# Patient Record
Sex: Male | Born: 1995 | Race: White | Hispanic: No | Marital: Single | Smoking: Never smoker
Health system: Southern US, Community
[De-identification: ages and names within clinical notes are randomized; demographics above are authoritative.]

## PROBLEM LIST (undated history)

## (undated) DIAGNOSIS — Q231 Congenital insufficiency of aortic valve: Secondary | ICD-10-CM

## (undated) HISTORY — DX: Congenital insufficiency of aortic valve: Q23.1

## (undated) HISTORY — PX: MYRINGOTOMY WITH TUBE PLACEMENT: SHX5663

---
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
## 2017-05-09 ENCOUNTER — Encounter: Payer: Managed Care, Other (non HMO) | Admitting: Physician Assistant

## 2017-05-09 ENCOUNTER — Encounter: Payer: Self-pay | Admitting: Physician Assistant

## 2017-05-09 ENCOUNTER — Ambulatory Visit (INDEPENDENT_AMBULATORY_CARE_PROVIDER_SITE_OTHER): Payer: Managed Care, Other (non HMO) | Admitting: Physician Assistant

## 2017-05-09 VITALS — BP 118/70 | HR 57 | Temp 98.6°F | Resp 14 | Ht 71.5 in | Wt 162.0 lb

## 2017-05-09 DIAGNOSIS — Z0000 Encounter for general adult medical examination without abnormal findings: Secondary | ICD-10-CM

## 2017-05-09 NOTE — Patient Instructions (Signed)
Preventive Care 18-39 Years, Male Preventive care refers to lifestyle choices and visits with your health care provider that can promote health and wellness. What does preventive care include?  A yearly physical exam. This is also called an annual well check.  Dental exams once or twice a year.  Routine eye exams. Ask your health care provider how often you should have your eyes checked.  Personal lifestyle choices, including: ? Daily care of your teeth and gums. ? Regular physical activity. ? Eating a healthy diet. ? Avoiding tobacco and drug use. ? Limiting alcohol use. ? Practicing safe sex. What happens during an annual well check? The services and screenings done by your health care provider during your annual well check will depend on your age, overall health, lifestyle risk factors, and family history of disease. Counseling Your health care provider may ask you questions about your:  Alcohol use.  Tobacco use.  Drug use.  Emotional well-being.  Home and relationship well-being.  Sexual activity.  Eating habits.  Work and work environment.  Screening You may have the following tests or measurements:  Height, weight, and BMI.  Blood pressure.  Lipid and cholesterol levels. These may be checked every 5 years starting at age 19.  Diabetes screening. This is done by checking your blood sugar (glucose) after you have not eaten for a while (fasting).  Skin check.  Hepatitis C blood test.  Hepatitis B blood test.  Sexually transmitted disease (STD) testing.  Discuss your test results, treatment options, and if necessary, the need for more tests with your health care provider. Vaccines Your health care provider may recommend certain vaccines, such as:  Influenza vaccine. This is recommended every year.  Tetanus, diphtheria, and acellular pertussis (Tdap, Td) vaccine. You may need a Td booster every 10 years.  Varicella vaccine. You may need this if you  have not been vaccinated.  HPV vaccine. If you are 26 or younger, you may need three doses over 6 months.  Measles, mumps, and rubella (MMR) vaccine. You may need at least one dose of MMR.You may also need a second dose.  Pneumococcal 13-valent conjugate (PCV13) vaccine. You may need this if you have certain conditions and have not been vaccinated.  Pneumococcal polysaccharide (PPSV23) vaccine. You may need one or two doses if you smoke cigarettes or if you have certain conditions.  Meningococcal vaccine. One dose is recommended if you are age 19-19 years and a first-year college student living in a residence hall, or if you have one of several medical conditions. You may also need additional booster doses.  Hepatitis A vaccine. You may need this if you have certain conditions or if you travel or work in places where you may be exposed to hepatitis A.  Hepatitis B vaccine. You may need this if you have certain conditions or if you travel or work in places where you may be exposed to hepatitis B.  Haemophilus influenzae type b (Hib) vaccine. You may need this if you have certain risk factors.  Talk to your health care provider about which screenings and vaccines you need and how often you need them. This information is not intended to replace advice given to you by your health care provider. Make sure you discuss any questions you have with your health care provider. Document Released: 11/16/2001 Document Revised: 06/09/2016 Document Reviewed: 07/22/2015 Elsevier Interactive Patient Education  2017 [employer].  

## 2017-05-09 NOTE — Progress Notes (Signed)
Pre visit review using our clinic review tool, if applicable. No additional management support is needed unless otherwise documented below in the visit note. 

## 2017-05-09 NOTE — Assessment & Plan Note (Signed)
Depression screen negative. Health Maintenance reviewed. Preventive schedule discussed and handout given in AVS. Will obtain fasting labs today. Discussed healthy diet and exercise regimen.  Discussed safe sex practices.  Denies STI screen today.

## 2017-05-09 NOTE — Progress Notes (Signed)
Patient presents to clinic today for annual exam.  Patient is fasting for labs.  Acute Concerns: Denies acute concerns today. Is playing basketball for [employer]. Needs clearance forms filled out today. Was recently evaluated by pediatric Cardiology for a murmur. Was noted to be related to bicuspid aortic valve. Nothing further was needed per Cardiology noted reviewed. Patient endorses well-balanced diet. Is very active. Patient denies chest pain, palpitations, lightheadedness, dizziness, vision changes or frequent headaches.   Health Maintenance: Immunizations -- up-to-date.  Past Medical History:  Diagnosis Date  . Bicuspid aortic valve     Past Surgical History:  Procedure Laterality Date  . MYRINGOTOMY WITH TUBE PLACEMENT      Current Outpatient Prescriptions on File Prior to Visit  Medication Sig Dispense Refill  . ibuprofen (ADVIL,MOTRIN) 200 MG tablet Take 400 mg by mouth every 6 (six) hours as needed for mild pain.    Marland Kitchen loratadine (CLARITIN) 10 MG tablet Take 10 mg by mouth daily.    . fluticasone (FLONASE) 50 MCG/ACT nasal spray Place 1 spray into both nostrils daily.     No current facility-administered medications on file prior to visit.     No Known Allergies  History reviewed. No pertinent family history.  Social History   Social History  . Marital status: Single    Spouse name: N/A  . Number of children: N/A  . Years of education: N/A   Occupational History  . Not on file.   Social History Main Topics  . Smoking status: Never Smoker  . Smokeless tobacco: Never Used  . Alcohol use No  . Drug use: No  . Sexual activity: Yes    Partners: Female    Birth control/ protection: Condom   Other Topics Concern  . Not on file   Social History Narrative  . No narrative on file   Review of Systems  Constitutional: Negative for fever and weight loss.  HENT: Negative for ear discharge, ear pain, hearing loss and tinnitus.   Eyes: Negative for blurred  vision, double vision, photophobia and pain.  Respiratory: Negative for cough and shortness of breath.   Cardiovascular: Negative for chest pain and palpitations.  Gastrointestinal: Negative for abdominal pain, blood in stool, constipation, diarrhea, heartburn, melena, nausea and vomiting.  Genitourinary: Negative for dysuria, flank pain, frequency, hematuria and urgency.  Musculoskeletal: Negative for falls.  Neurological: Negative for dizziness, loss of consciousness and headaches.  Endo/Heme/Allergies: Negative for environmental allergies.  Psychiatric/Behavioral: Negative for depression, hallucinations, substance abuse and suicidal ideas. The patient is not nervous/anxious and does not have insomnia.    BP 118/70   Pulse (!) 57   Temp 98.6 F (37 C) (Oral)   Resp 14   Ht 5' 11.5" (1.816 m)   Wt 162 lb (73.5 kg)   SpO2 99%   BMI 22.28 kg/m   Physical Exam  Constitutional: He is oriented to person, place, and time and well-developed, well-nourished, and in no distress.  HENT:  Head: Normocephalic and atraumatic.  Right Ear: External ear normal.  Left Ear: External ear normal.  Nose: Nose normal.  Mouth/Throat: Oropharynx is clear and moist. No oropharyngeal exudate.  Eyes: Pupils are equal, round, and reactive to light. Conjunctivae and EOM are normal.  Neck: Neck supple. No thyromegaly present.  Cardiovascular: Normal rate, regular rhythm, normal heart sounds and intact distal pulses.   Pulmonary/Chest: Effort normal and breath sounds normal. No respiratory distress. He has no wheezes. He has no rales. He exhibits no  tenderness.  Abdominal: Soft. Bowel sounds are normal. He exhibits no distension and no mass. There is no tenderness. There is no rebound and no guarding.  Genitourinary: Testes/scrotum normal and penis normal. No discharge found.  Lymphadenopathy:    He has no cervical adenopathy.  Neurological: He is alert and oriented to person, place, and time.  Skin: Skin is  warm and dry. No rash noted.  Psychiatric: Affect normal.  Vitals reviewed.  Assessment/Plan: Annual physical exam Depression screen negative. Health Maintenance reviewed. Preventive schedule discussed and handout given in AVS. Will obtain fasting labs today. Discussed healthy diet and exercise regimen.  Discussed safe sex practices.  Denies STI screen today.    Piedad ClimesMartin, Savion Washam Cody, PA-C

## 2018-03-07 ENCOUNTER — Ambulatory Visit (INDEPENDENT_AMBULATORY_CARE_PROVIDER_SITE_OTHER): Payer: Managed Care, Other (non HMO) | Admitting: Physician Assistant

## 2018-03-07 ENCOUNTER — Encounter: Payer: Self-pay | Admitting: Physician Assistant

## 2018-03-07 ENCOUNTER — Other Ambulatory Visit: Payer: Self-pay

## 2018-03-07 VITALS — BP 100/78 | HR 62 | Temp 98.4°F | Resp 14 | Ht 71.5 in | Wt 172.0 lb

## 2018-03-07 DIAGNOSIS — H60331 Swimmer's ear, right ear: Secondary | ICD-10-CM

## 2018-03-07 MED ORDER — NEOMYCIN-POLYMYXIN-HC 3.5-10000-1 OT SOLN: 3.0000 [drp] | Freq: Three times a day (TID) | OTIC | 0 refills | Status: DC

## 2018-03-07 NOTE — Patient Instructions (Signed)
Please use antibiotic drops as directed. Wear cotton in the ear when showering. Limit swimming until this is resolved. Tylenol if needed for pain.    Otitis Externa Otitis externa is an infection of the outer ear canal. The outer ear canal is the area between the outside of the ear and the eardrum. Otitis externa is sometimes called "swimmer's ear." Follow these instructions at home:  If you were given antibiotic ear drops, use them as told by your doctor. Do not stop using them even if your condition gets better.  Take over-the-counter and prescription medicines only as told by your doctor.  Keep all follow-up visits as told by your doctor. This is important. How is this prevented?  Keep your ear dry. Use the corner of a towel to dry your ear after you swim or bathe.  Try not to scratch or put things in your ear. Doing these things makes it easier for germs to grow in your ear.  Avoid swimming in lakes, dirty water, or pools that may not have the right amount of a chemical called chlorine.  Consider making ear drops and putting 3 or 4 drops in each ear after you swim. Ask your doctor about how you can make ear drops. Contact a doctor if:  You have a fever.  After 3 days your ear is still red, swollen, or painful.  After 3 days you still have pus coming from your ear.  Your redness, swelling, or pain gets worse.  You have a really bad headache.  You have redness, swelling, pain, or tenderness behind your ear. This information is not intended to replace advice given to you by your health care provider. Make sure you discuss any questions you have with your health care provider. Document Released: 03/08/2008 Document Revised: 10/16/2015 Document Reviewed: 06/30/2015 Elsevier Interactive Patient Education  [employer]2018 Elsevier Inc.

## 2018-03-07 NOTE — Progress Notes (Signed)
   Patient presents to clinic today c/o R ear pain x 3 days starting after swimming at the pool. Denies trauma or injury. Denies drainage from ear of change in hearing. Denies fever, chills or URI symptoms. Has significant history of swimmer's ear and notes this feels similar. Last time he notes it got severely infected so he wanted to be seen much sooner this go-round..   Past Medical History:  Diagnosis Date  . Bicuspid aortic valve     Current Outpatient Medications on File Prior to Visit  Medication Sig Dispense Refill  . diphenhydrAMINE (BENADRYL) 50 MG capsule Take 50 mg by mouth every 6 (six) hours as needed.     No current facility-administered medications on file prior to visit.     No Known Allergies  History reviewed. No pertinent family history.  Social History   Socioeconomic History  . Marital status: Single    Spouse name: Not on file  . Number of children: Not on file  . Years of education: Not on file  . Highest education level: Not on file  Occupational History  . Not on file  Social Needs  . Financial resource strain: Not on file  . Food insecurity:    Worry: Not on file    Inability: Not on file  . Transportation needs:    Medical: Not on file    Non-medical: Not on file  Tobacco Use  . Smoking status: Never Smoker  . Smokeless tobacco: Never Used  Substance and Sexual Activity  . Alcohol use: No  . Drug use: No  . Sexual activity: Yes    Partners: Female    Birth control/protection: Condom  Lifestyle  . Physical activity:    Days per week: Not on file    Minutes per session: Not on file  . Stress: Not on file  Relationships  . Social connections:    Talks on phone: Not on file    Gets together: Not on file    Attends religious service: Not on file    Active member of club or organization: Not on file    Attends meetings of clubs or organizations: Not on file    Relationship status: Not on file  Other Topics Concern  . Not on file    Social History Narrative  . Not on file   Review of Systems - See HPI.  All other ROS are negative.  BP 100/78   Pulse 62   Temp 98.4 F (36.9 C) (Oral)   Resp 14   Ht 6' (1.829 m)   Wt 172 lb (78 kg)   BMI 23.33 kg/m   Physical Exam  Constitutional: He is oriented to person, place, and time. He appears well-developed and well-nourished.  HENT:  Head: Normocephalic and atraumatic.  Right Ear: Ear canal normal. There is swelling.  Left Ear: External ear normal.  Nose: Nose normal.  Mouth/Throat: Oropharynx is clear and moist.  Neck: Neck supple.  Cardiovascular: Normal rate, regular rhythm and normal heart sounds.  Pulmonary/Chest: Effort normal.  Lymphadenopathy:    He has no cervical adenopathy.  Neurological: He is alert and oriented to person, place, and time.  Vitals reviewed.  Assessment/Plan: 1. Acute swimmer's ear of right side Mild. Supportive measures and OTC medications reviewed. Start cortisporin otic. Follow-up if not improving.  - neomycin-polymyxin-hydrocortisone (CORTISPORIN) OTIC solution; Place 3 drops into the right ear 3 (three) times daily.  Dispense: 10 mL; Refill: 0   Piedad ClimesWilliam Cody Scheryl Sanborn, PA-C

## 2018-04-18 ENCOUNTER — Ambulatory Visit (INDEPENDENT_AMBULATORY_CARE_PROVIDER_SITE_OTHER): Payer: Managed Care, Other (non HMO)

## 2018-04-18 DIAGNOSIS — Z23 Encounter for immunization: Secondary | ICD-10-CM

## 2018-04-18 NOTE — Progress Notes (Signed)
Patient came in today to receive his first PPD vaccination and has been notified to come back for a recheck in 24-36 hours to be read. Patient verbalized an understanding and tolerated well.  PPD given in left forearm.

## 2018-04-21 ENCOUNTER — Encounter: Payer: Self-pay | Admitting: General Practice

## 2018-04-21 LAB — TB SKIN TEST
INDURATION: 0 mm
TB Skin Test: NEGATIVE

## 2019-04-04 ENCOUNTER — Other Ambulatory Visit: Payer: Self-pay

## 2019-04-04 DIAGNOSIS — Z20822 Contact with and (suspected) exposure to covid-19: Secondary | ICD-10-CM

## 2019-04-10 LAB — NOVEL CORONAVIRUS, NAA: SARS-CoV-2, NAA: NOT DETECTED

## 2019-04-10 LAB — SPECIMEN STATUS REPORT

## 2020-02-28 ENCOUNTER — Other Ambulatory Visit: Payer: Self-pay

## 2020-02-28 ENCOUNTER — Encounter: Payer: Self-pay | Admitting: Family Medicine

## 2020-02-28 ENCOUNTER — Telehealth (INDEPENDENT_AMBULATORY_CARE_PROVIDER_SITE_OTHER): Payer: Managed Care, Other (non HMO) | Admitting: Family Medicine

## 2020-02-28 VITALS — Temp 98.1°F | Ht 71.0 in | Wt 181.0 lb

## 2020-02-28 DIAGNOSIS — J020 Streptococcal pharyngitis: Secondary | ICD-10-CM | POA: Diagnosis not present

## 2020-02-28 DIAGNOSIS — Z20828 Contact with and (suspected) exposure to other viral communicable diseases: Secondary | ICD-10-CM | POA: Diagnosis not present

## 2020-02-28 DIAGNOSIS — Z03818 Encounter for observation for suspected exposure to other biological agents ruled out: Secondary | ICD-10-CM | POA: Diagnosis not present

## 2020-02-28 MED ORDER — AMOXICILLIN 875 MG PO TABS
875.0000 mg | ORAL_TABLET | Freq: Two times a day (BID) | ORAL | 0 refills | Status: DC
Start: 2020-02-28 — End: 2020-09-01

## 2020-02-28 NOTE — Progress Notes (Signed)
   Virtual Visit via Video   I connected with patient on 02/28/20 at  4:00 PM EDT by a video enabled telemedicine application and verified that I am speaking with the correct person using two identifiers.  Location patient: Home Location provider: [facility], Office Persons participating in the virtual visit: Patient, Provider, CMA (Jess B)  I discussed the limitations of evaluation and management by telemedicine and the availability of in person appointments. The patient expressed understanding and agreed to proceed.  Subjective:   HPI:   Sore throat- sxs started Sat/Sun.  No fevers.  Pt had rapid COVID test today and it was negative.  + pain w/ swallowing.  Some improvement w/ ibuprofen.  + sore lymph nodes.  ROS:   See pertinent positives and negatives per HPI.  Patient Active Problem List   Diagnosis Date Noted  . Annual physical exam 05/09/2017  . Aortic valve, bicuspid 05/20/2015  . Abnormal ECG 05/20/2015    Social History   Tobacco Use  . Smoking status: Never Smoker  . Smokeless tobacco: Never Used  Substance Use Topics  . Alcohol use: No   No current outpatient medications on file.  No Known Allergies  Objective:   Temp 98.1 F (36.7 C) (Oral)   Ht 5\' 11"  (1.803 m)   Wt 181 lb (82.1 kg)   BMI 25.24 kg/m  AAOx3, NAD NCAT, EOMI No obvious CN deficits Coloring WNL Erythematous posterior pharynx w/ mildly enlarged tonsils bilaterally and exudates present, L>R Pt is able to speak clearly, coherently without shortness of breath or increased work of breathing.  Thought process is linear.  Mood is appropriate.   Assessment and Plan:   Strep Pharyngitis- new.  Pt's sxs and PE are concerning for possible strep w/ exception of lack of fever.  COVID test negative.  Will start empiric Amoxicillin but did discuss that if pt has mono, he could develop a rash from the Amox.  Pt is aware and understands that he would need to stop abx and call office.   Continue ibuprofen for pain relief.  Pt expressed understanding and is in agreement w/ plan.   , MD 02/28/2020

## 2020-02-28 NOTE — Progress Notes (Signed)
I have discussed the procedure for the virtual visit with the patient who has given consent to proceed with assessment and treatment.   Pt unable to obtain vitals.   Yorel Redder L Namrata Dangler, CMA     

## 2020-07-10 ENCOUNTER — Telehealth: Payer: Self-pay

## 2020-07-10 NOTE — Telephone Encounter (Signed)
Received referral from [facility] [id number] Referral sent to scheduling Records on File

## 2020-09-01 ENCOUNTER — Ambulatory Visit (INDEPENDENT_AMBULATORY_CARE_PROVIDER_SITE_OTHER): Payer: BC Managed Care – PPO | Admitting: Cardiovascular Disease

## 2020-09-01 ENCOUNTER — Encounter: Payer: Self-pay | Admitting: Cardiovascular Disease

## 2020-09-01 ENCOUNTER — Other Ambulatory Visit: Payer: Self-pay

## 2020-09-01 VITALS — BP 118/66 | HR 55 | Ht 72.0 in | Wt 192.6 lb

## 2020-09-01 DIAGNOSIS — Q231 Congenital insufficiency of aortic valve: Secondary | ICD-10-CM

## 2020-09-01 NOTE — Patient Instructions (Signed)
Medication Instructions:  Your physician recommends that you continue on your current medications as directed. Please refer to the Current Medication list given to you today.  *If you need a refill on your cardiac medications before your next appointment, please call your pharmacy*   Testing/Procedures: Your physician has requested that you have an echocardiogram. Echocardiography is a painless test that uses sound waves to create images of your heart. It provides your doctor with information about the size and shape of your heart and how well your heart's chambers and valves are working. This procedure takes approximately one hour. There are no restrictions for this procedure.   Follow-Up: At [facility], you and your health needs are our priority.  As part of our continuing mission to provide you with exceptional heart care, we have created designated Provider Care Teams.  These Care Teams include your primary Cardiologist (physician) and Advanced Practice Providers (APPs -  Physician Assistants and Nurse Practitioners) who all work together to provide you with the care you need, when you need it.  Your next appointment:   1 year(s)  The format for your next appointment:   In Person  Provider:   You may see Christopher McAlhany, MD or one of the following Advanced Practice Providers on your designated Care Team:    Dayna Dunn, PA-C  Michele Lenze, PA-C    

## 2020-09-01 NOTE — Progress Notes (Signed)
Chief Complaint  Patient presents with  . New Patient (Initial Visit)    Bicuspid aortic valve   History of Present Illness: 24 yo male with history of bicuspid aortic valve who is here today to establish care in our office. He has been followed in the [facility] cardiology office here in [facility] [place] for known bicuspid aortic valve. Last echo in July 2018 with evidence of bicuspid aortic valve with no aortic stenosis, trivial AI. He feels well. He is very active. No chest pain or dyspnea.   Primary Care Physician: Sheliah Hatch, MD  Past Medical History:  Diagnosis Date  . Bicuspid aortic valve     Past Surgical History:  Procedure Laterality Date  . MYRINGOTOMY WITH TUBE PLACEMENT      No current outpatient medications on file.   No current facility-administered medications for this visit.    No Known Allergies  Social History   Socioeconomic History  . Marital status: Single    Spouse name: Not on file  . Number of children: 0  . Years of education: Not on file  . Highest education level: Not on file  Occupational History  . Occupation: Painting  Tobacco Use  . Smoking status: Never Smoker  . Smokeless tobacco: Never Used  Substance and Sexual Activity  . Alcohol use: Yes  . Drug use: No  . Sexual activity: Yes    Partners: Female    Birth control/protection: Condom  Other Topics Concern  . Not on file  Social History Narrative  . Not on file   Social Determinants of Health   Financial Resource Strain:   . Difficulty of Paying Living Expenses: Not on file  Food Insecurity:   . Worried About Programme researcher, broadcasting/film/video in the Last Year: Not on file  . Ran Out of Food in the Last Year: Not on file  Transportation Needs:   . Lack of Transportation (Medical): Not on file  . Lack of Transportation (Non-Medical): Not on file  Physical Activity:   . Days of Exercise per Week: Not on file  . Minutes of Exercise per Session: Not on file  Stress:   .  Feeling of Stress : Not on file  Social Connections:   . Frequency of Communication with Friends and Family: Not on file  . Frequency of Social Gatherings with Friends and Family: Not on file  . Attends Religious Services: Not on file  . Active Member of Clubs or Organizations: Not on file  . Attends Banker Meetings: Not on file  . Marital Status: Not on file  Intimate Partner Violence:   . Fear of Current or Ex-Partner: Not on file  . Emotionally Abused: Not on file  . Physically Abused: Not on file  . Sexually Abused: Not on file    History reviewed. No pertinent family history.  His mother and father are alive and with no medical issues.   Review of Systems:  As stated in the HPI and otherwise negative.   BP 118/66   Pulse (!) 55   Ht 6' (1.829 m)   Wt 192 lb 9.6 oz (87.4 kg)   SpO2 98%   BMI 26.12 kg/m   Physical Examination: General: Well developed, well nourished, NAD  HEENT: OP clear, mucus membranes moist  SKIN: warm, dry. No rashes. Neuro: No focal deficits  Musculoskeletal: Muscle strength 5/5 all ext  Psychiatric: Mood and affect normal  Neck: No JVD, no carotid bruits, no thyromegaly, no  lymphadenopathy.  Lungs:Clear bilaterally, no wheezes, rhonci, crackles Cardiovascular: Regular rate and rhythm. No murmurs, gallops or rubs. Abdomen:Soft. Bowel sounds present. Non-tender.  Extremities: No lower extremity edema. Pulses are 2 + in the bilateral DP/PT.  EKG:  EKG is ordered today. The ekg ordered today demonstrates Sinus, rate 55 bpm. Inferior and lateral T wave inversion  Recent Labs: No results found for requested labs within last 8760 hours.   Lipid Panel No results found for: CHOL, TRIG, HDL, CHOLHDL, VLDL, LDLCALC, LDLDIRECT   Wt Readings from Last 3 Encounters:  09/01/20 192 lb 9.6 oz (87.4 kg)  02/28/20 181 lb (82.1 kg)  03/07/18 172 lb (78 kg)      Assessment and Plan:   1. Bicuspid aortic valve: He has no murmur on exam. He  has no chest pain or dyspnea. Will arrange an echo now to evaluate his valve.   Current medicines are reviewed at length with the patient today.  The patient does not have concerns regarding medicines.  The following changes have been made:  no change  Labs/ tests ordered today include:   Orders Placed This Encounter  Procedures  . EKG 12-Lead  . ECHOCARDIOGRAM COMPLETE     Disposition:   FU with me in one year.    Signed, Verne Carrow, MD 09/01/2020 9:36 AM    [facility] [address] [place], [place], [place]  [address] Phone: [phone]; Fax: [phone]

## 2020-09-24 ENCOUNTER — Ambulatory Visit (HOSPITAL_COMMUNITY): Payer: BC Managed Care – PPO | Attending: Cardiology

## 2020-09-24 ENCOUNTER — Other Ambulatory Visit: Payer: Self-pay

## 2020-09-24 DIAGNOSIS — Q231 Congenital insufficiency of aortic valve: Secondary | ICD-10-CM | POA: Insufficient documentation

## 2020-09-24 LAB — ECHOCARDIOGRAM COMPLETE
AR max vel: 2.04 cm2
AV Area VTI: 2.32 cm2
AV Area mean vel: 2.01 cm2
AV Mean grad: 6 mmHg
AV Peak grad: 10.4 mmHg
Ao pk vel: 1.61 m/s
Area-P 1/2: 4.21 cm2
S' Lateral: 3.1 cm

## 2021-02-26 NOTE — Telephone Encounter (Signed)
Called pt in response to message.  Pt states he is doing well. He works outside, has been working out and also started Scientist, physiological again about 2 weeks ago.  starting 4-5 days ago has been experiencing intermittent mild pain center of his chest lasting only one second.  At max 2/10.  Occurs at rest/activity.  Does not reproduce on palpation or movement.  Has not changed his diet any.  Feels "great" but since new he felt he should report it.    No belching, or heartburn symptoms.  No nausea or SOB.  Last echo was 12/21:   Kathleene Hazel, MD  09/24/2020 3:01 PM EST      His heart is strong. His aortic valve is known to be bicuspid. Mild leakiness but the valve opens well with no stenosis. Repeat echo in 2-3 years. cdm     Pt aware I will forward to Dr. Clifton James for recommendations and then we will call him back.

## 2021-02-27 NOTE — Telephone Encounter (Signed)
Kathleene Hazel, MD  You 1 hour ago (7:14 AM)   Nothing to do. I would tell him to keep exercising. [employer] pt, no VM set up.  Replied in MyChart.

## 2021-04-01 ENCOUNTER — Encounter: Payer: Self-pay | Admitting: *Deleted

## 2021-05-14 DIAGNOSIS — N451 Epididymitis: Secondary | ICD-10-CM | POA: Diagnosis not present

## 2022-06-12 DIAGNOSIS — Z113 Encounter for screening for infections with a predominantly sexual mode of transmission: Secondary | ICD-10-CM | POA: Diagnosis not present
